# Patient Record
Sex: Male | Born: 1990 | Race: Black or African American | Hispanic: Yes | Marital: Single | Smoking: Current some day smoker
Health system: Southern US, Community
[De-identification: ages and names within clinical notes are randomized; demographics above are authoritative.]

## PROBLEM LIST (undated history)

## (undated) DIAGNOSIS — S8992XA Unspecified injury of left lower leg, initial encounter: Secondary | ICD-10-CM

## (undated) DIAGNOSIS — G43909 Migraine, unspecified, not intractable, without status migrainosus: Secondary | ICD-10-CM

## (undated) DIAGNOSIS — IMO0002 Reserved for concepts with insufficient information to code with codable children: Secondary | ICD-10-CM

## (undated) HISTORY — DX: Reserved for concepts with insufficient information to code with codable children: IMO0002

## (undated) HISTORY — DX: Unspecified injury of left lower leg, initial encounter: S89.92XA

## (undated) HISTORY — DX: Migraine, unspecified, not intractable, without status migrainosus: G43.909

---

## 2010-08-09 ENCOUNTER — Ambulatory Visit: Payer: Self-pay | Admitting: Internal Medicine

## 2013-04-28 ENCOUNTER — Encounter: Payer: Self-pay | Admitting: Neurology

## 2013-04-28 ENCOUNTER — Institutional Professional Consult (permissible substitution): Payer: Self-pay | Admitting: Neurology

## 2013-04-29 ENCOUNTER — Institutional Professional Consult (permissible substitution): Payer: 59 | Admitting: Neurology

## 2014-12-13 ENCOUNTER — Ambulatory Visit
Admission: RE | Admit: 2014-12-13 | Discharge: 2014-12-13 | Disposition: A | Discharge status: Home / Self Care | Payer: 59 | Source: Ambulatory Visit | Attending: Internal Medicine | Admitting: Internal Medicine

## 2014-12-13 ENCOUNTER — Other Ambulatory Visit: Payer: Self-pay | Admitting: Internal Medicine

## 2014-12-13 DIAGNOSIS — R52 Pain, unspecified: Secondary | ICD-10-CM

## 2014-12-13 DIAGNOSIS — M79644 Pain in right finger(s): Secondary | ICD-10-CM | POA: Diagnosis not present

## 2015-01-08 ENCOUNTER — Encounter: Payer: Self-pay | Admitting: Internal Medicine

## 2015-01-08 DIAGNOSIS — F515 Nightmare disorder: Secondary | ICD-10-CM | POA: Insufficient documentation

## 2015-01-08 DIAGNOSIS — G43909 Migraine, unspecified, not intractable, without status migrainosus: Secondary | ICD-10-CM | POA: Insufficient documentation

## 2015-01-09 ENCOUNTER — Ambulatory Visit: Payer: Self-pay | Admitting: Internal Medicine

## 2015-06-18 ENCOUNTER — Ambulatory Visit: Payer: 59 | Admitting: Internal Medicine

## 2015-06-20 ENCOUNTER — Ambulatory Visit: Admission: EM | Admit: 2015-06-20 | Discharge: 2015-06-20 | Discharge status: Absent without leave | Payer: 59

## 2015-12-20 DIAGNOSIS — S8992XA Unspecified injury of left lower leg, initial encounter: Secondary | ICD-10-CM

## 2015-12-20 HISTORY — DX: Unspecified injury of left lower leg, initial encounter: S89.92XA

## 2016-01-16 ENCOUNTER — Encounter: Payer: Self-pay | Admitting: Internal Medicine

## 2016-01-16 ENCOUNTER — Ambulatory Visit (INDEPENDENT_AMBULATORY_CARE_PROVIDER_SITE_OTHER): Payer: BLUE CROSS/BLUE SHIELD | Admitting: Internal Medicine

## 2016-01-16 VITALS — BP 138/78 | HR 77 | Resp 16 | Ht 73.0 in | Wt 263.4 lb

## 2016-01-16 DIAGNOSIS — S86919D Strain of unspecified muscle(s) and tendon(s) at lower leg level, unspecified leg, subsequent encounter: Secondary | ICD-10-CM

## 2016-01-16 DIAGNOSIS — F17200 Nicotine dependence, unspecified, uncomplicated: Secondary | ICD-10-CM

## 2016-01-16 DIAGNOSIS — Z720 Tobacco use: Secondary | ICD-10-CM | POA: Diagnosis not present

## 2016-01-16 DIAGNOSIS — S86819D Strain of other muscle(s) and tendon(s) at lower leg level, unspecified leg, subsequent encounter: Secondary | ICD-10-CM
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
## 2021-07-21 DEATH — deceased
